# Patient Record
Sex: Female | Born: 1960 | Race: White | Hispanic: No | Marital: Married | Smoking: Never smoker
Health system: Southern US, Community
[De-identification: ages and names within clinical notes are randomized; demographics above are authoritative.]

## PROBLEM LIST (undated history)

## (undated) DIAGNOSIS — I10 Essential (primary) hypertension: Secondary | ICD-10-CM

---

## 1997-12-02 ENCOUNTER — Other Ambulatory Visit: Admission: RE | Admit: 1997-12-02 | Discharge: 1997-12-02 | Payer: Self-pay | Admitting: Obstetrics & Gynecology

## 1999-12-18 ENCOUNTER — Other Ambulatory Visit: Admission: RE | Admit: 1999-12-18 | Discharge: 1999-12-18 | Payer: Self-pay | Admitting: Obstetrics and Gynecology

## 2000-11-29 ENCOUNTER — Other Ambulatory Visit: Admission: RE | Admit: 2000-11-29 | Discharge: 2000-11-29 | Payer: Self-pay | Admitting: Obstetrics and Gynecology

## 2001-12-04 ENCOUNTER — Other Ambulatory Visit: Admission: RE | Admit: 2001-12-04 | Discharge: 2001-12-04 | Payer: Self-pay | Admitting: Obstetrics and Gynecology

## 2003-01-04 ENCOUNTER — Other Ambulatory Visit: Admission: RE | Admit: 2003-01-04 | Discharge: 2003-01-04 | Payer: Self-pay | Admitting: Obstetrics and Gynecology

## 2003-01-10 ENCOUNTER — Ambulatory Visit (HOSPITAL_COMMUNITY): Admission: RE | Admit: 2003-01-10 | Discharge: 2003-01-10 | Payer: Self-pay | Admitting: Obstetrics and Gynecology

## 2003-01-10 ENCOUNTER — Encounter: Payer: Self-pay | Admitting: Obstetrics and Gynecology

## 2004-01-07 ENCOUNTER — Other Ambulatory Visit: Admission: RE | Admit: 2004-01-07 | Discharge: 2004-01-07 | Payer: Self-pay | Admitting: Obstetrics and Gynecology

## 2005-04-15 ENCOUNTER — Other Ambulatory Visit: Admission: RE | Admit: 2005-04-15 | Discharge: 2005-04-15 | Payer: Self-pay | Admitting: Obstetrics and Gynecology

## 2005-04-15 ENCOUNTER — Encounter: Admission: RE | Admit: 2005-04-15 | Discharge: 2005-04-15 | Payer: Self-pay | Admitting: Obstetrics and Gynecology

## 2006-05-23 ENCOUNTER — Encounter: Admission: RE | Admit: 2006-05-23 | Discharge: 2006-05-23 | Payer: Self-pay | Admitting: Obstetrics and Gynecology

## 2006-07-06 ENCOUNTER — Encounter: Admission: RE | Admit: 2006-07-06 | Discharge: 2006-07-06 | Payer: Self-pay | Admitting: Obstetrics and Gynecology

## 2006-07-13 ENCOUNTER — Other Ambulatory Visit: Admission: RE | Admit: 2006-07-13 | Discharge: 2006-07-13 | Payer: Self-pay

## 2006-07-13 ENCOUNTER — Encounter: Admission: RE | Admit: 2006-07-13 | Discharge: 2006-07-13 | Payer: Self-pay | Admitting: Obstetrics and Gynecology

## 2006-07-13 ENCOUNTER — Encounter (INDEPENDENT_AMBULATORY_CARE_PROVIDER_SITE_OTHER): Payer: Self-pay | Admitting: *Deleted

## 2006-08-03 ENCOUNTER — Encounter: Admission: RE | Admit: 2006-08-03 | Discharge: 2006-08-03 | Payer: Self-pay | Admitting: Obstetrics and Gynecology

## 2007-10-03 ENCOUNTER — Encounter: Admission: RE | Admit: 2007-10-03 | Discharge: 2007-10-03 | Payer: Self-pay | Admitting: Internal Medicine

## 2008-10-16 ENCOUNTER — Encounter: Admission: RE | Admit: 2008-10-16 | Discharge: 2008-10-16 | Payer: Self-pay | Admitting: Obstetrics and Gynecology

## 2008-11-08 ENCOUNTER — Encounter: Admission: RE | Admit: 2008-11-08 | Discharge: 2008-11-08 | Payer: Self-pay | Admitting: Obstetrics and Gynecology

## 2009-11-24 ENCOUNTER — Encounter: Admission: RE | Admit: 2009-11-24 | Discharge: 2009-11-24 | Payer: Self-pay | Admitting: Internal Medicine

## 2010-12-16 ENCOUNTER — Other Ambulatory Visit: Payer: Self-pay | Admitting: Obstetrics and Gynecology

## 2010-12-16 DIAGNOSIS — Z1231 Encounter for screening mammogram for malignant neoplasm of breast: Secondary | ICD-10-CM

## 2010-12-18 ENCOUNTER — Ambulatory Visit
Admission: RE | Admit: 2010-12-18 | Discharge: 2010-12-18 | Disposition: A | Discharge status: Home / Self Care | Payer: BC Managed Care – PPO | Source: Ambulatory Visit | Attending: Obstetrics and Gynecology | Admitting: Obstetrics and Gynecology

## 2010-12-18 DIAGNOSIS — Z1231 Encounter for screening mammogram for malignant neoplasm of breast: Secondary | ICD-10-CM

## 2012-08-15 ENCOUNTER — Other Ambulatory Visit: Payer: Self-pay

## 2012-08-15 DIAGNOSIS — Z1231 Encounter for screening mammogram for malignant neoplasm of breast: Secondary | ICD-10-CM

## 2012-08-31 ENCOUNTER — Ambulatory Visit
Admission: RE | Admit: 2012-08-31 | Discharge: 2012-08-31 | Disposition: A | Discharge status: Home / Self Care | Payer: BC Managed Care – PPO | Source: Ambulatory Visit

## 2012-08-31 DIAGNOSIS — Z1231 Encounter for screening mammogram for malignant neoplasm of breast: Secondary | ICD-10-CM

## 2015-01-09 ENCOUNTER — Other Ambulatory Visit: Payer: Self-pay | Admitting: Orthopedic Surgery

## 2015-01-09 DIAGNOSIS — M545 Low back pain: Secondary | ICD-10-CM

## 2015-01-09 DIAGNOSIS — R1084 Generalized abdominal pain: Secondary | ICD-10-CM

## 2015-01-09 DIAGNOSIS — N200 Calculus of kidney: Secondary | ICD-10-CM

## 2015-01-13 ENCOUNTER — Ambulatory Visit
Admission: RE | Admit: 2015-01-13 | Discharge: 2015-01-13 | Disposition: A | Discharge status: Home / Self Care | Payer: BC Managed Care – PPO | Source: Ambulatory Visit | Attending: Orthopedic Surgery | Admitting: Orthopedic Surgery

## 2015-01-13 DIAGNOSIS — R1084 Generalized abdominal pain: Secondary | ICD-10-CM

## 2015-01-13 DIAGNOSIS — M545 Low back pain: Secondary | ICD-10-CM

## 2015-01-15 ENCOUNTER — Ambulatory Visit
Admission: RE | Admit: 2015-01-15 | Discharge: 2015-01-15 | Disposition: A | Discharge status: Home / Self Care | Payer: BC Managed Care – PPO | Source: Ambulatory Visit | Attending: Orthopedic Surgery | Admitting: Orthopedic Surgery

## 2015-01-15 DIAGNOSIS — R1084 Generalized abdominal pain: Secondary | ICD-10-CM

## 2015-01-15 DIAGNOSIS — N200 Calculus of kidney: Secondary | ICD-10-CM

## 2015-01-29 ENCOUNTER — Other Ambulatory Visit (HOSPITAL_COMMUNITY): Payer: Self-pay | Admitting: Orthopedic Surgery

## 2015-01-29 DIAGNOSIS — M545 Low back pain, unspecified: Secondary | ICD-10-CM

## 2015-02-07 ENCOUNTER — Ambulatory Visit (HOSPITAL_COMMUNITY)
Admission: RE | Admit: 2015-02-07 | Discharge: 2015-02-07 | Disposition: A | Discharge status: Home / Self Care | Payer: BC Managed Care – PPO | Source: Ambulatory Visit | Attending: Orthopedic Surgery | Admitting: Orthopedic Surgery

## 2015-02-07 DIAGNOSIS — K760 Fatty (change of) liver, not elsewhere classified: Secondary | ICD-10-CM | POA: Diagnosis not present

## 2015-02-07 DIAGNOSIS — M545 Low back pain, unspecified: Secondary | ICD-10-CM

## 2015-02-07 DIAGNOSIS — K769 Liver disease, unspecified: Secondary | ICD-10-CM | POA: Diagnosis present

## 2015-02-07 LAB — GLUCOSE, CAPILLARY: GLUCOSE-CAPILLARY: 100 mg/dL — AB (ref 65–99)

## 2015-02-07 MED ORDER — FLUDEOXYGLUCOSE F - 18 (FDG) INJECTION
9.8000 | Freq: Once | INTRAVENOUS | Status: DC | PRN
  Administered 2015-02-07: 9.8 via INTRAVENOUS
  Filled 2015-02-07: qty 9.8

## 2015-11-06 ENCOUNTER — Other Ambulatory Visit: Payer: Self-pay | Admitting: Obstetrics and Gynecology

## 2015-11-06 DIAGNOSIS — R928 Other abnormal and inconclusive findings on diagnostic imaging of breast: Secondary | ICD-10-CM

## 2015-11-13 ENCOUNTER — Ambulatory Visit
Admission: RE | Admit: 2015-11-13 | Discharge: 2015-11-13 | Disposition: A | Discharge status: Home / Self Care | Payer: BC Managed Care – PPO | Source: Ambulatory Visit | Attending: Obstetrics and Gynecology | Admitting: Obstetrics and Gynecology

## 2015-11-13 ENCOUNTER — Other Ambulatory Visit: Payer: Self-pay | Admitting: Obstetrics and Gynecology

## 2015-11-13 DIAGNOSIS — N6320 Unspecified lump in the left breast, unspecified quadrant: Secondary | ICD-10-CM

## 2015-11-13 DIAGNOSIS — R928 Other abnormal and inconclusive findings on diagnostic imaging of breast: Secondary | ICD-10-CM

## 2015-11-14 ENCOUNTER — Other Ambulatory Visit: Payer: Self-pay | Admitting: Obstetrics and Gynecology

## 2015-11-14 DIAGNOSIS — N6320 Unspecified lump in the left breast, unspecified quadrant: Secondary | ICD-10-CM

## 2015-11-17 ENCOUNTER — Ambulatory Visit
Admission: RE | Admit: 2015-11-17 | Discharge: 2015-11-17 | Disposition: A | Discharge status: Home / Self Care | Payer: BC Managed Care – PPO | Source: Ambulatory Visit | Attending: Obstetrics and Gynecology | Admitting: Obstetrics and Gynecology

## 2015-11-17 DIAGNOSIS — N6320 Unspecified lump in the left breast, unspecified quadrant: Secondary | ICD-10-CM

## 2018-11-04 ENCOUNTER — Emergency Department (HOSPITAL_COMMUNITY): Payer: BC Managed Care – PPO

## 2018-11-04 ENCOUNTER — Other Ambulatory Visit: Payer: Self-pay

## 2018-11-04 ENCOUNTER — Emergency Department: Payer: Self-pay

## 2018-11-04 ENCOUNTER — Emergency Department (HOSPITAL_COMMUNITY)
Admission: EM | Admit: 2018-11-04 | Discharge: 2018-11-04 | Disposition: A | Discharge status: Home / Self Care | Payer: BC Managed Care – PPO | Attending: Emergency Medicine | Admitting: Emergency Medicine

## 2018-11-04 ENCOUNTER — Encounter (HOSPITAL_COMMUNITY): Payer: Self-pay | Admitting: *Deleted

## 2018-11-04 DIAGNOSIS — S52124A Nondisplaced fracture of head of right radius, initial encounter for closed fracture: Secondary | ICD-10-CM | POA: Diagnosis not present

## 2018-11-04 DIAGNOSIS — Y999 Unspecified external cause status: Secondary | ICD-10-CM | POA: Diagnosis not present

## 2018-11-04 DIAGNOSIS — I10 Essential (primary) hypertension: Secondary | ICD-10-CM | POA: Insufficient documentation

## 2018-11-04 DIAGNOSIS — Y93H2 Activity, gardening and landscaping: Secondary | ICD-10-CM | POA: Insufficient documentation

## 2018-11-04 DIAGNOSIS — W1789XA Other fall from one level to another, initial encounter: Secondary | ICD-10-CM | POA: Diagnosis not present

## 2018-11-04 DIAGNOSIS — Y92017 Garden or yard in single-family (private) house as the place of occurrence of the external cause: Secondary | ICD-10-CM | POA: Insufficient documentation

## 2018-11-04 DIAGNOSIS — S59911A Unspecified injury of right forearm, initial encounter: Secondary | ICD-10-CM | POA: Diagnosis present

## 2018-11-04 DIAGNOSIS — R52 Pain, unspecified: Secondary | ICD-10-CM

## 2018-11-04 DIAGNOSIS — W19XXXA Unspecified fall, initial encounter: Secondary | ICD-10-CM

## 2018-11-04 DIAGNOSIS — S42401A Unspecified fracture of lower end of right humerus, initial encounter for closed fracture: Secondary | ICD-10-CM | POA: Diagnosis not present

## 2018-11-04 DIAGNOSIS — S52501A Unspecified fracture of the lower end of right radius, initial encounter for closed fracture: Secondary | ICD-10-CM

## 2018-11-04 HISTORY — DX: Essential (primary) hypertension: I10

## 2018-11-04 MED ORDER — OXYCODONE-ACETAMINOPHEN 5-325 MG PO TABS: 1.0000 | ORAL_TABLET | Freq: Four times a day (QID) | ORAL | 0 refills | Status: AC | PRN

## 2018-11-04 MED ORDER — FENTANYL CITRATE (PF) 100 MCG/2ML IJ SOLN: 50.0000 ug | Freq: Once | INTRAMUSCULAR | Status: DC

## 2018-11-04 MED ORDER — FENTANYL CITRATE (PF) 100 MCG/2ML IJ SOLN
50.0000 ug | Freq: Once | INTRAMUSCULAR | Status: AC
  Administered 2018-11-04: 50 ug via INTRAMUSCULAR
  Filled 2018-11-04: qty 2

## 2018-11-04 NOTE — ED Notes (Signed)
Pain increasing pain med given  Ice packs refilled

## 2018-11-04 NOTE — ED Notes (Signed)
New ice packs to her rt elbow and forearm

## 2018-11-04 NOTE — Discharge Instructions (Addendum)
In addition to what your papers say you have fractured in your wrist, your x-rays here show that you have fractured your radial head (in your elbow, along with a possible fracture of the elbow side of the humerus (possibly seen as the ulnar fracture on your x-rays from urgent care).    Please take Ibuprofen (Advil, motrin) and Tylenol (acetaminophen) to relieve your pain.  You may take up to 600 MG (3 pills) of normal strength ibuprofen every 8 hours as needed.  In between doses of ibuprofen you make take tylenol, up to 1,000 mg (two extra strength pills).  Do not take more than 3,000 mg tylenol in a 24 hour period.  Please check all medication labels as many medications such as pain and cold medications may contain tylenol.  Do not drink alcohol while taking these medications.  Do not take other NSAID'S while taking ibuprofen (such as aleve or naproxen).  Please take ibuprofen with food to decrease stomach upset.  Today you received medications that may make you sleepy or impair your ability to make decisions.  For the next 24 hours please do not drive, operate heavy machinery, care for a small child with out another adult present, or perform any activities that may cause harm to you or someone else if you were to fall asleep or be impaired.   You are being prescribed a medication which may make you sleepy. Please follow up of listed precautions for at least 24 hours after taking one dose.

## 2018-11-04 NOTE — ED Notes (Signed)
The pt brought her xrays with her dvd

## 2018-11-04 NOTE — ED Provider Notes (Signed)
[facility] EMERGENCY DEPARTMENT Provider Note   CSN: [id number] Arrival date & time: 11/04/18  1526    History   Chief Complaint Chief Complaint  Patient presents with  . Fall    HPI Pamela Pugh is a 58 y.o. female with a past medical history of hypertension who presents today from urgent care for evaluation after fall.  She was outside trimming hedges when she looked up and lost her balance.  She says that she was approximately 1 foot up.  She did not strike her head or pass out.  Denies any prodromal symptoms.  She landed on outstretched right hand.  She is right-hand dominant.  She initially went to an urgent care who obtained x-rays and sent her here to "see an orthopedic."  She denies any numbness or tingling.  Most of her pain is in her right wrist and elbow.  She denies any other injuries.  No blood thinners.       HPI  Past Medical History:  Diagnosis Date  . Hypertension     There are no active problems to display for this patient.   History reviewed. No pertinent surgical history.   OB History   No obstetric history on file.      Home Medications    Prior to Admission medications   Medication Sig Start Date End Date Taking? Authorizing Provider  oxyCODONE-acetaminophen (PERCOCET/ROXICET) 5-325 MG tablet Take 1 tablet by mouth every 6 (six) hours as needed for severe pain. 11/04/18   Lorin Glass, PA-C    Family History No family history on file.  Social History Social History   Tobacco Use  . Smoking status: Never Smoker  . Smokeless tobacco: Never Used  Substance Use Topics  . Alcohol use: Never    Frequency: Never  . Drug use: Not on file     Allergies   Patient has no known allergies.   Review of Systems Review of Systems  Constitutional: Negative for chills and fever.  Musculoskeletal: Negative for back pain and neck pain.       Right arm pain  Skin: Negative for color change, rash and wound.   Neurological: Negative for weakness and headaches.  All other systems reviewed and are negative.    Physical Exam Updated Vital Signs BP 124/87   Pulse 76   Temp 98.6 F (37 C)   Resp 18   Ht 5\' 6"  (1.676 m)   Wt 96.6 kg   LMP 12/11/2010   SpO2 98%   BMI 34.38 kg/m   Physical Exam Vitals signs and nursing note reviewed.  Constitutional:      General: She is not in acute distress.    Appearance: She is well-developed. She is not diaphoretic.  HENT:     Head: Normocephalic and atraumatic. No raccoon eyes, Battle's sign, abrasion, contusion, masses or laceration.     Comments: No raccoon's eyes or battle signs bilaterally.      Mouth/Throat:     Comments: Two sutures present in lip. Wound appears well healed.  Eyes:     General: No scleral icterus.       Right eye: No discharge.        Left eye: No discharge.     Conjunctiva/sclera: Conjunctivae normal.  Neck:     Musculoskeletal: Full passive range of motion without pain, normal range of motion and neck supple. No neck rigidity or muscular tenderness.  Cardiovascular:     Rate and Rhythm: Normal  rate and regular rhythm.     Pulses: Normal pulses.          Radial pulses are 2+ on the right side and 2+ on the left side.     Heart sounds: Normal heart sounds.  Pulmonary:     Effort: Pulmonary effort is normal. No respiratory distress.     Breath sounds: Normal breath sounds and air entry. No stridor.  Abdominal:     General: There is no distension.     Tenderness: There is no abdominal tenderness.  Musculoskeletal:        General: No deformity.     Comments: There is generalized tenderness to palpation along the right wrist, forearm, and elbow.  There is localized pinpoint tenderness to palpation along the right lateral shoulder without crepitus or deformity palpated.  Compartments in right upper and lower arm is soft and easily compressible.  She is able to wiggle her fingers on the right hand.  There is no obvious  abnormal angulation or deformity of the right arm.  Skin:    General: Skin is warm and dry.  Neurological:     General: No focal deficit present.     Mental Status: She is alert.     Motor: No abnormal muscle tone.     Comments: Sensation intact to light touch of right upper extremity.  Psychiatric:        Mood and Affect: Mood normal.        Behavior: Behavior normal.      ED Treatments / Results  Labs (all labs ordered are listed, but only abnormal results are displayed) Labs Reviewed - No data to display  EKG None  Radiology Dg Shoulder Right  Result Date: 11/04/2018 CLINICAL DATA:  Status post fall with right shoulder pain. EXAM: RIGHT SHOULDER - 2+ VIEW COMPARISON:  None. FINDINGS: There is no evidence of fracture or dislocation. There is no evidence of arthropathy or other focal bone abnormality. Soft tissues are unremarkable. IMPRESSION: Negative. Electronically Signed   By: Sherian ReinWei-Chen  Lin M.D.   On: 11/04/2018 18:35   Dg Elbow Complete Right  Result Date: 11/04/2018 CLINICAL DATA:  Status post fall with right elbow pain. EXAM: RIGHT ELBOW - COMPLETE 3+ VIEW COMPARISON:  None. FINDINGS: There is intra-articular displaced fracture of the radial head. There is a fracture fragment medial to the medial aspect of the distal humerus suggesting fracture. IMPRESSION: Fracture radial head. Fracture fragment medial to the medial aspect of the distal humerus, suggesting fracture. Electronically Signed   By: Sherian ReinWei-Chen  Lin M.D.   On: 11/04/2018 18:35   Dg Forearm Right  Result Date: 11/04/2018 CLINICAL DATA:  Status post fall with right distal arm pain. EXAM: RIGHT FOREARM - 2 VIEW COMPARISON:  None. FINDINGS: There is fracture radial head. There is a fracture fragment medial to the medial aspect of the distal humerus. IMPRESSION: Fracture radial head. Fracture fragment medial to the medial aspect of the distal humerus, question donor site humerus. Electronically Signed   By: Sherian ReinWei-Chen  Lin  M.D.   On: 11/04/2018 18:42   Dg Outside Films Extremity  Result Date: 11/04/2018 This examination belongs to an outside facility and is stored here for comparison purposes only.  Contact the originating outside institution for any associated report or interpretation.     Procedures Procedures (including critical care time)  Medications Ordered in ED Medications  fentaNYL (SUBLIMAZE) injection 50 mcg (50 mcg Intramuscular Given 11/04/18 1926)     Initial Impression / Assessment  and Plan / ED Course  I have reviewed the triage vital signs and the nursing notes.  Pertinent labs & imaging results that were available during my care of the patient were reviewed by me and considered in my medical decision making (see chart for details).       Patient presents today from urgent care for evaluation after fall.  She was trimming a hedge when she looked up and lost her balance causing her to fall with a FOOSH-like mechanism on my right hand.  She is neurovascularly intact both before and after splint was applied.  She is right-hand dominant.  X-rays obtained at outside facility show concern for proximal and distal radius fractures along with possible ulnar fractures.  Some of the external x-rays were able to be imported.  Additional x-rays of the elbow and shoulder were obtained as she had pain in these areas.  She denied any other injuries, did not strike her head or pass out.  She denied any prodromal symptoms.  X-rays show concern for fracture to the radial head with possible humerus fracture/bone chip however this is not clearly delineated on x-rays.  X-rays from outside facility of the wrist show concern for a comminuted fracture of the distal radius with intra-articular involvement.  I spoke with on-call doctor for [facility] as this is patient's established practice.  They recommended posterior splint and outpatient follow-up in the office.  PMP was consulted and she is given RX for  percocet at home.  Discussed use of OTC pain medications along with elevation.    Return precautions were discussed with patient who states their understanding.  At the time of discharge patient denied any unaddressed complaints or concerns.  Patient is agreeable for discharge home.    Final Clinical Impressions(s) / ED Diagnoses   Final diagnoses:  Fall, initial encounter  Closed nondisplaced fracture of head of right radius, initial encounter  Closed fracture of distal end of right radius, unspecified fracture morphology, initial encounter    ED Discharge Orders         Ordered    oxyCODONE-acetaminophen (PERCOCET/ROXICET) 5-325 MG tablet  Every 6 hours PRN     11/04/18 2114           Cristina GongHammond, Sahil Milner W, PA-C 11/05/18 0014    Alvira MondaySchlossman, Erin, MD 11/10/18 1415

## 2018-11-04 NOTE — ED Notes (Signed)
Pt returned from xray

## 2018-11-04 NOTE — ED Triage Notes (Signed)
The pt fell from a ledge while trimming bushes earlier today she was seen at  An urgent care at [place] humerus fractures and elbow and she is having pain in her upper humerus

## 2018-11-04 NOTE — ED Notes (Signed)
Splint is too tight ortho tech to look at the wrap again

## 2018-11-04 NOTE — ED Notes (Signed)
To x-ray

## 2018-11-04 NOTE — ED Notes (Signed)
Pt talking to her family on her cell

## 2018-11-08 ENCOUNTER — Other Ambulatory Visit: Payer: Self-pay | Admitting: Orthopedic Surgery

## 2018-11-08 DIAGNOSIS — M25531 Pain in right wrist: Secondary | ICD-10-CM

## 2018-11-08 DIAGNOSIS — M25521 Pain in right elbow: Secondary | ICD-10-CM

## 2018-11-17 ENCOUNTER — Ambulatory Visit
Admission: RE | Admit: 2018-11-17 | Discharge: 2018-11-17 | Disposition: A | Discharge status: Home / Self Care | Payer: BC Managed Care – PPO | Source: Ambulatory Visit | Attending: Orthopedic Surgery | Admitting: Orthopedic Surgery

## 2018-11-17 DIAGNOSIS — M25531 Pain in right wrist: Secondary | ICD-10-CM

## 2018-11-17 DIAGNOSIS — M25521 Pain in right elbow: Secondary | ICD-10-CM

## 2019-04-30 ENCOUNTER — Other Ambulatory Visit: Payer: Self-pay | Admitting: Obstetrics and Gynecology

## 2019-04-30 DIAGNOSIS — R928 Other abnormal and inconclusive findings on diagnostic imaging of breast: Secondary | ICD-10-CM

## 2019-05-15 ENCOUNTER — Other Ambulatory Visit: Payer: BC Managed Care – PPO

## 2019-05-18 ENCOUNTER — Ambulatory Visit
Admission: RE | Admit: 2019-05-18 | Discharge: 2019-05-18 | Disposition: A | Discharge status: Home / Self Care | Payer: BC Managed Care – PPO | Source: Ambulatory Visit | Attending: Obstetrics and Gynecology | Admitting: Obstetrics and Gynecology

## 2019-05-18 ENCOUNTER — Other Ambulatory Visit: Payer: Self-pay | Admitting: Obstetrics and Gynecology

## 2019-05-18 ENCOUNTER — Other Ambulatory Visit: Payer: Self-pay

## 2019-05-18 DIAGNOSIS — R928 Other abnormal and inconclusive findings on diagnostic imaging of breast: Secondary | ICD-10-CM

## 2019-05-18 DIAGNOSIS — N6320 Unspecified lump in the left breast, unspecified quadrant: Secondary | ICD-10-CM

## 2019-06-28 ENCOUNTER — Ambulatory Visit: Payer: BC Managed Care – PPO

## 2019-11-16 ENCOUNTER — Ambulatory Visit
Admission: RE | Admit: 2019-11-16 | Discharge: 2019-11-16 | Disposition: A | Discharge status: Home / Self Care | Payer: BC Managed Care – PPO | Source: Ambulatory Visit | Attending: Obstetrics and Gynecology | Admitting: Obstetrics and Gynecology

## 2019-11-16 ENCOUNTER — Other Ambulatory Visit: Payer: Self-pay

## 2019-11-16 DIAGNOSIS — N6320 Unspecified lump in the left breast, unspecified quadrant: Secondary | ICD-10-CM

## 2021-11-19 IMAGING — MG MM DIGITAL DIAGNOSTIC UNILAT*L* W/ TOMO W/ CAD
4 series · 4 of 12 positions shown · non-contrast
Comparison: Previous exam(s).

CLINICAL DATA: Follow-up left breast probably benign mass.

EXAM:
DIGITAL DIAGNOSTIC LEFT MAMMOGRAM WITH CAD AND TOMO
ULTRASOUND LEFT BREAST

[L CC synth-2D]
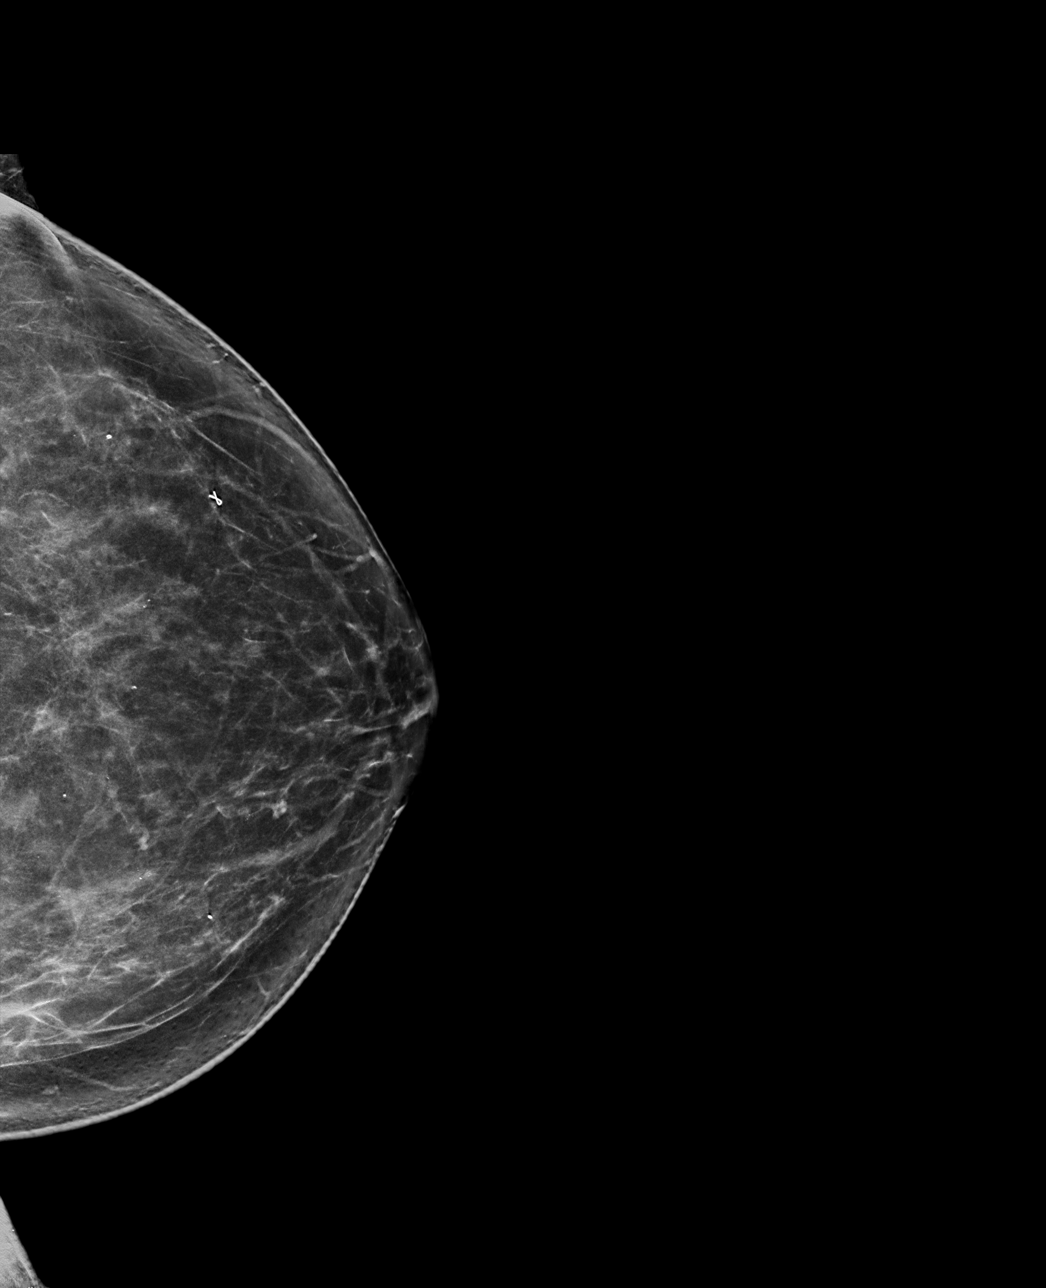

[L MLO synth-2D]
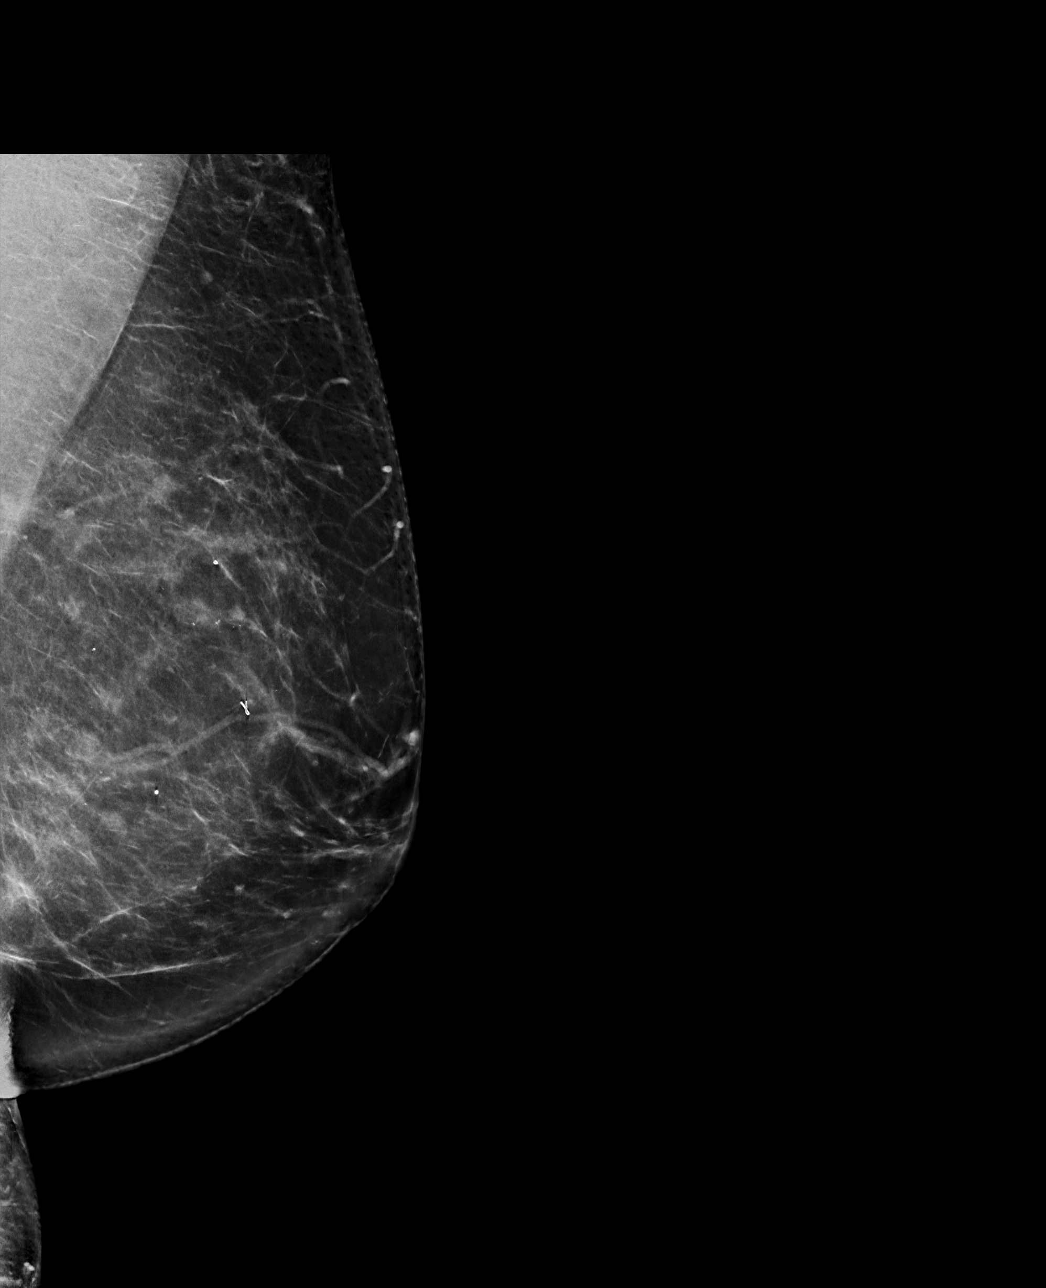

[L CC tomo · tomo slice 41/81.0]
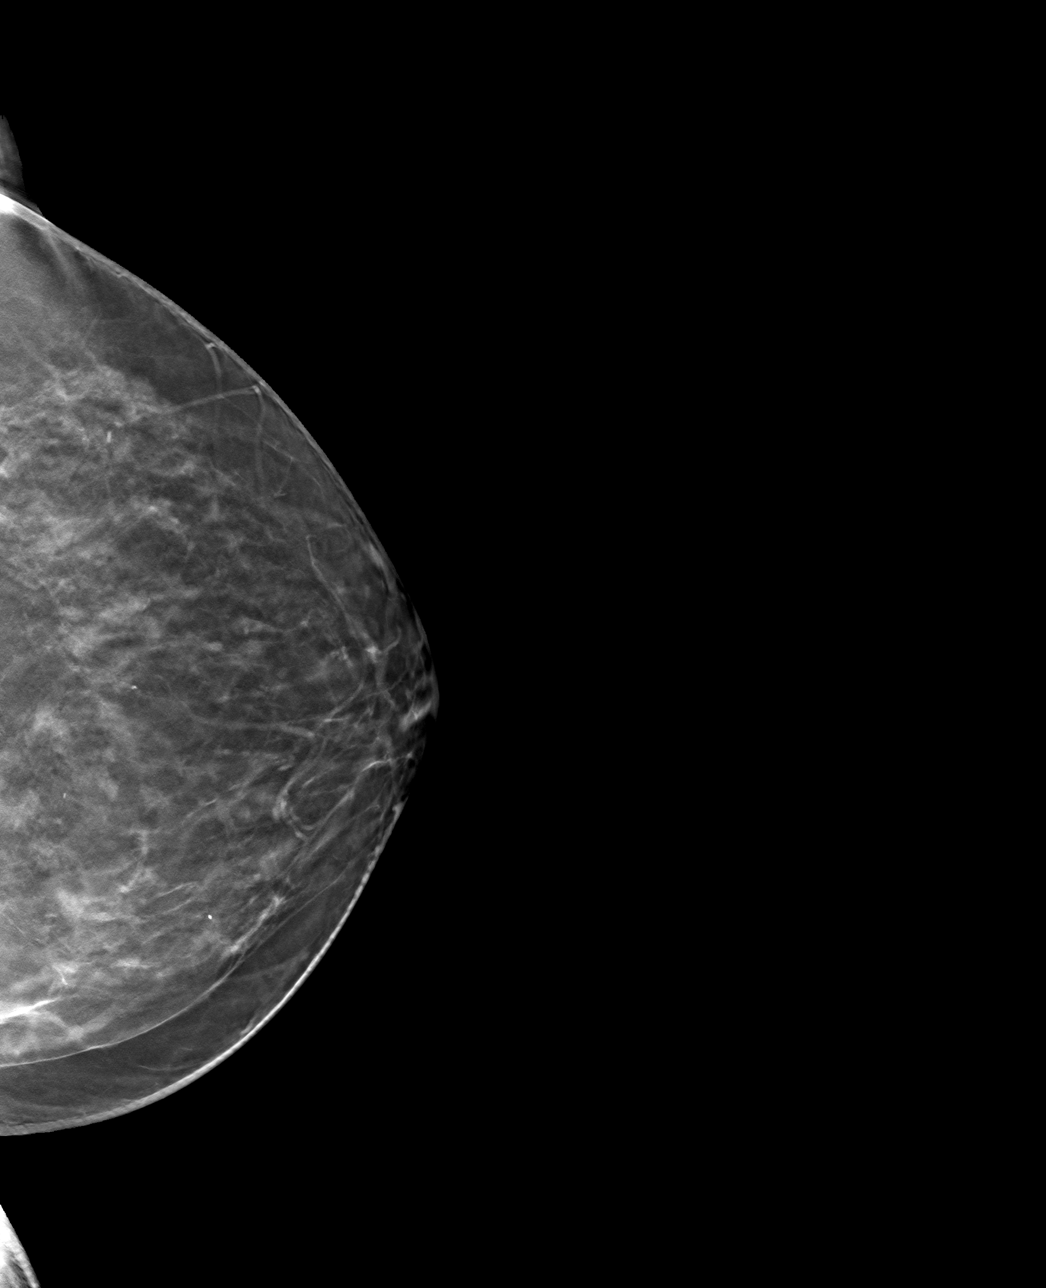

[L MLO tomo · tomo slice 46/91.0]
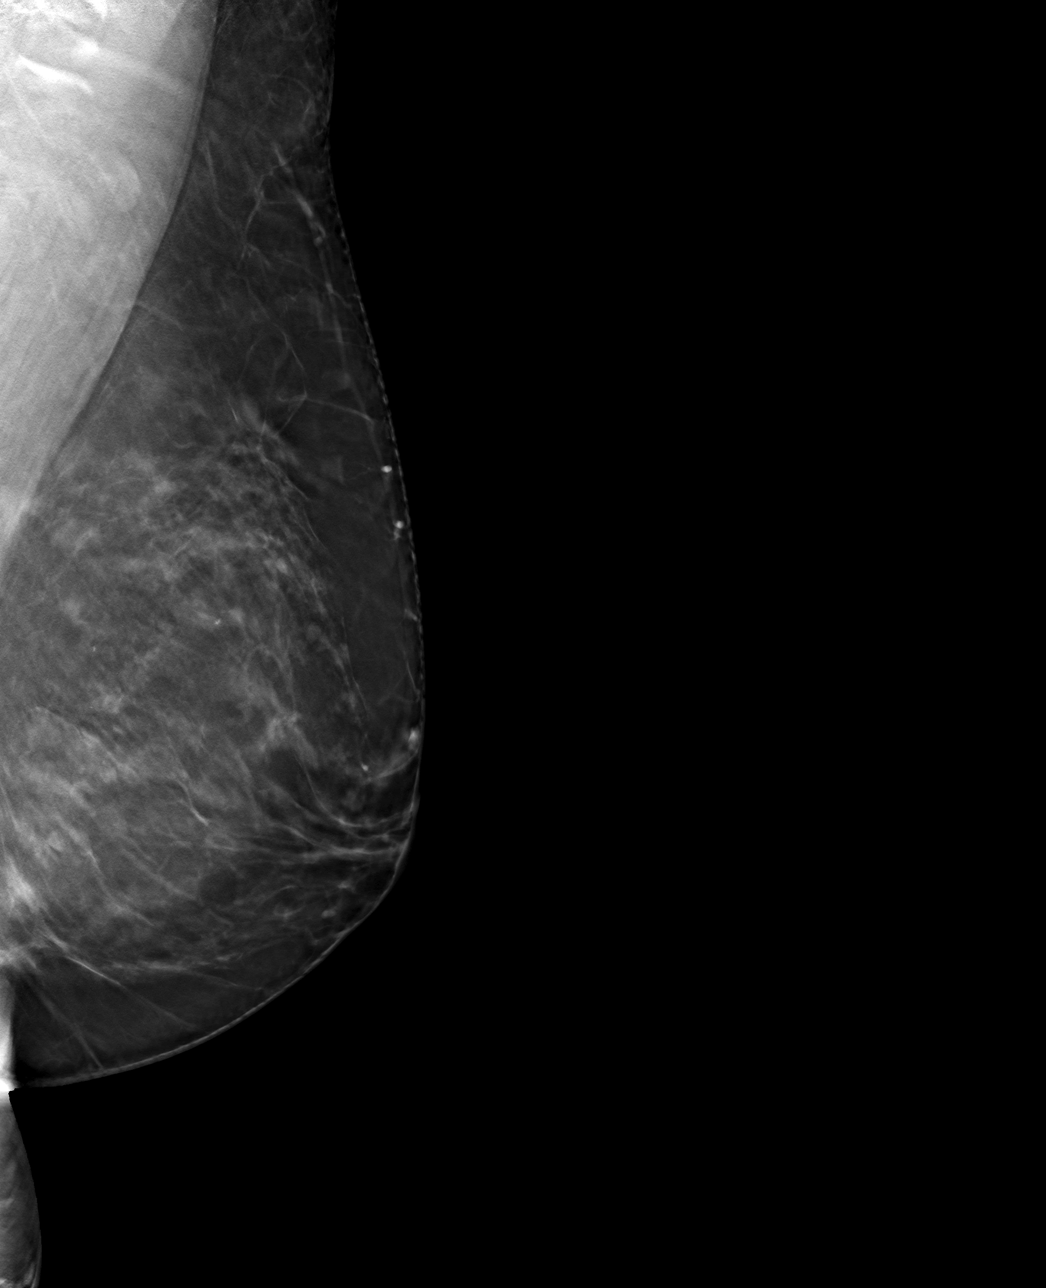

[4 of 12 positions shown; findings below may reference images not displayed]

ACR Breast Density Category b: There are scattered areas of
fibroglandular density.
FINDINGS: A previously demonstrated rounded, circumscribed mass in the medial
left breast is no longer seen. A previously demonstrated oval,
circumscribed mass in the inferior left breast is smaller. No
interval findings suspicious for malignancy.

Mammographic images were processed with CAD.

On physical exam, no mass is palpable in the medial left breast.

Targeted ultrasound is performed, showing normal appearing breast
tissue throughout the medial left breast.
IMPRESSION: Resolved cyst in the medial left breast and decreased size of a
previously demonstrated small cyst in the 5 o'clock position of the
left breast. No evidence of malignancy.

RECOMMENDATION:
Bilateral screening mammogram in 5 months when due. That will be 1
year since mammographic evaluation of the right breast.

I have discussed the findings and recommendations with the patient.
If applicable, a reminder letter will be sent to the patient
regarding the next appointment.

BI-RADS CATEGORY  2: Benign.

## 2021-12-08 ENCOUNTER — Other Ambulatory Visit: Payer: Self-pay | Admitting: Obstetrics and Gynecology

## 2021-12-08 DIAGNOSIS — R928 Other abnormal and inconclusive findings on diagnostic imaging of breast: Secondary | ICD-10-CM

## 2021-12-16 ENCOUNTER — Ambulatory Visit
Admission: RE | Admit: 2021-12-16 | Discharge: 2021-12-16 | Disposition: A | Discharge status: Home / Self Care | Payer: BC Managed Care – PPO | Source: Ambulatory Visit | Attending: Obstetrics and Gynecology | Admitting: Obstetrics and Gynecology

## 2021-12-16 ENCOUNTER — Other Ambulatory Visit: Payer: Self-pay | Admitting: Obstetrics and Gynecology

## 2021-12-16 DIAGNOSIS — R928 Other abnormal and inconclusive findings on diagnostic imaging of breast: Secondary | ICD-10-CM

## 2021-12-16 DIAGNOSIS — N6310 Unspecified lump in the right breast, unspecified quadrant: Secondary | ICD-10-CM

## 2022-06-21 ENCOUNTER — Ambulatory Visit
Admission: RE | Admit: 2022-06-21 | Discharge: 2022-06-21 | Disposition: A | Discharge status: Home / Self Care | Payer: BC Managed Care – PPO | Source: Ambulatory Visit | Attending: Obstetrics and Gynecology | Admitting: Obstetrics and Gynecology

## 2022-06-21 ENCOUNTER — Other Ambulatory Visit: Payer: Self-pay | Admitting: Obstetrics and Gynecology

## 2022-06-21 DIAGNOSIS — N6310 Unspecified lump in the right breast, unspecified quadrant: Secondary | ICD-10-CM
